# Patient Record
Sex: Male | Born: 1985 | Race: Black or African American | Hispanic: No | Marital: Married | Smoking: Never smoker
Health system: Southern US, Community
[De-identification: ages and names within clinical notes are randomized; demographics above are authoritative.]

## PROBLEM LIST (undated history)

## (undated) DIAGNOSIS — D571 Sickle-cell disease without crisis: Secondary | ICD-10-CM

---

## 2006-08-17 ENCOUNTER — Emergency Department (HOSPITAL_COMMUNITY): Admission: EM | Admit: 2006-08-17 | Discharge: 2006-08-17 | Payer: Self-pay | Admitting: Emergency Medicine

## 2016-01-18 ENCOUNTER — Observation Stay (HOSPITAL_COMMUNITY)
Admission: EM | Admit: 2016-01-18 | Discharge: 2016-01-19 | Disposition: A | Discharge status: Home / Self Care | Payer: Managed Care, Other (non HMO) | Attending: Emergency Medicine | Admitting: Emergency Medicine

## 2016-01-18 ENCOUNTER — Encounter (HOSPITAL_COMMUNITY): Payer: Self-pay

## 2016-01-18 ENCOUNTER — Emergency Department (HOSPITAL_COMMUNITY): Payer: Managed Care, Other (non HMO)

## 2016-01-18 DIAGNOSIS — D573 Sickle-cell trait: Secondary | ICD-10-CM | POA: Diagnosis not present

## 2016-01-18 DIAGNOSIS — M879 Osteonecrosis, unspecified: Secondary | ICD-10-CM

## 2016-01-18 DIAGNOSIS — M25551 Pain in right hip: Secondary | ICD-10-CM | POA: Diagnosis present

## 2016-01-18 DIAGNOSIS — M25561 Pain in right knee: Secondary | ICD-10-CM | POA: Insufficient documentation

## 2016-01-18 DIAGNOSIS — R52 Pain, unspecified: Secondary | ICD-10-CM

## 2016-01-18 DIAGNOSIS — M87851 Other osteonecrosis, right femur: Principal | ICD-10-CM | POA: Insufficient documentation

## 2016-01-18 HISTORY — DX: Sickle-cell disease without crisis: D57.1

## 2016-01-18 LAB — COMPREHENSIVE METABOLIC PANEL
ALBUMIN: 4.3 g/dL (ref 3.5–5.0)
ALK PHOS: 49 U/L (ref 38–126)
ALT: 17 U/L (ref 17–63)
AST: 26 U/L (ref 15–41)
Anion gap: 10 (ref 5–15)
BUN: 9 mg/dL (ref 6–20)
CALCIUM: 9.5 mg/dL (ref 8.9–10.3)
CO2: 24 mmol/L (ref 22–32)
CREATININE: 0.85 mg/dL (ref 0.61–1.24)
Chloride: 105 mmol/L (ref 101–111)
GFR calc non Af Amer: >60 mL/min (ref >60)
GLUCOSE: 99 mg/dL (ref 65–99)
Potassium: 4 mmol/L (ref 3.5–5.1)
SODIUM: 139 mmol/L (ref 135–145)
Total Bilirubin: 1.9 mg/dL — ABNORMAL HIGH (ref 0.3–1.2)
Total Protein: 7.1 g/dL (ref 6.5–8.1)

## 2016-01-18 LAB — CBC WITH DIFFERENTIAL/PLATELET
BASOS PCT: 0 %
Basophils Absolute: 0.1 10*3/uL (ref 0.0–0.1)
EOS ABS: 0 10*3/uL (ref 0.0–0.7)
EOS PCT: 0 %
HCT: 31 % — ABNORMAL LOW (ref 39.0–52.0)
Hemoglobin: 11.2 g/dL — ABNORMAL LOW (ref 13.0–17.0)
LYMPHS ABS: 1.5 10*3/uL (ref 0.7–4.0)
Lymphocytes Relative: 10 %
MCH: 28.2 pg (ref 26.0–34.0)
MCHC: 36.1 g/dL — AB (ref 30.0–36.0)
MCV: 78.1 fL (ref 78.0–100.0)
MONO ABS: 1.9 10*3/uL — AB (ref 0.1–1.0)
MONOS PCT: 13 %
Neutro Abs: 11.5 10*3/uL — ABNORMAL HIGH (ref 1.7–7.7)
Neutrophils Relative %: 77 %
Platelets: 368 10*3/uL (ref 150–400)
RBC: 3.97 MIL/uL — ABNORMAL LOW (ref 4.22–5.81)
RDW: 16.6 % — AB (ref 11.5–15.5)
WBC: 14.9 10*3/uL — ABNORMAL HIGH (ref 4.0–10.5)

## 2016-01-18 MED ORDER — SODIUM CHLORIDE 0.9 % IV SOLN: INTRAVENOUS | Status: DC

## 2016-01-18 MED ORDER — KETOROLAC TROMETHAMINE 15 MG/ML IJ SOLN
15.0000 mg | Freq: Once | INTRAMUSCULAR | Status: DC
  Filled 2016-01-18: qty 1

## 2016-01-18 MED ORDER — MORPHINE SULFATE (PF) 4 MG/ML IV SOLN: 4.0000 mg | Freq: Once | INTRAVENOUS | Status: DC

## 2016-01-18 MED ORDER — HYDROCODONE-ACETAMINOPHEN 5-325 MG PO TABS
2.0000 | ORAL_TABLET | Freq: Once | ORAL | Status: AC
  Administered 2016-01-18: 2 via ORAL
  Filled 2016-01-18: qty 2

## 2016-01-18 MED ORDER — KETOROLAC TROMETHAMINE 60 MG/2ML IM SOLN
60.0000 mg | Freq: Once | INTRAMUSCULAR | Status: AC
  Administered 2016-01-18: 60 mg via INTRAMUSCULAR
  Filled 2016-01-18: qty 2

## 2016-01-18 MED ORDER — HYDROMORPHONE HCL 1 MG/ML IJ SOLN
1.0000 mg | Freq: Once | INTRAMUSCULAR | Status: DC
  Filled 2016-01-18: qty 1

## 2016-01-18 NOTE — ED Notes (Signed)
Pt unsure if he wants to stay, refusing IV at this time.

## 2016-01-18 NOTE — Consult Note (Addendum)
Reason for Consult: Right hip pain Referring Physician: [name] is an 30 y.o. male.  HPI: Arthur Gutierrez is a 30 year old patient with history of sickle cell trait. Reports today history of relatively atraumatic right hip pain. He was jumping on the trampoline some but developed hip pain but several days after that to the point where it rated 8 out of 10. He is from [place] but lives here in [place]. 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 He reports having had 5 or 6 crises and his wife. They were typically treated with anti-inflammatories pain medicine and on 2 occasions IV fluids.  Past Medical History  Diagnosis Date  . Sickle cell anemia (HCC)     No past surgical history on file.  No family history on file.  Social History:  reports that he has never smoked. He does not have any smokeless tobacco history on file. He reports that he drinks about 1.8 oz of alcohol per week. He reports that he does not use illicit drugs.  Allergies: No Known Allergies  Medications: I have reviewed the patient's current medications.  Results for orders placed or performed during the hospital encounter of 01/18/16 (from the past 48 hour(s))  Comprehensive metabolic panel     Status: Abnormal   Collection Time: 01/18/16  5:53 PM  Result Value Ref Range   Sodium 139 135 - 145 mmol/L   Potassium 4.0 3.5 - 5.1 mmol/L   Chloride 105 101 - 111 mmol/L   CO2 24 22 - 32 mmol/L   Glucose, Bld 99 65 - 99 mg/dL   BUN 9 6 - 20 mg/dL   Creatinine, Ser 0.85 0.61 - 1.24 mg/dL   Calcium 9.5 8.9 - 10.3 mg/dL   Total Protein 7.1 6.5 - 8.1 g/dL   Albumin 4.3 3.5 - 5.0 g/dL   AST 26 15 - 41 U/L   ALT 17 17 - 63 U/L   Alkaline Phosphatase 49 38 - 126 U/L   Total Bilirubin 1.9 (H) 0.3 - 1.2 mg/dL   GFR calc non Af Amer >60 >60 mL/min   GFR calc Af Amer >60 >60 mL/min    Comment: (NOTE) The eGFR has been calculated using the CKD EPI equation. This calculation has not been validated in  all clinical situations. eGFR's persistently <60 mL/min signify possible Chronic Kidney Disease.    Anion gap 10 5 - 15  CBC with Differential     Status: Abnormal   Collection Time: 01/18/16  5:53 PM  Result Value Ref Range   WBC 14.9 (H) 4.0 - 10.5 K/uL   RBC 3.97 (L) 4.22 - 5.81 MIL/uL   Hemoglobin 11.2 (L) 13.0 - 17.0 g/dL   HCT 31.0 (L) 39.0 - 52.0 %   MCV 78.1 78.0 - 100.0 fL   MCH 28.2 26.0 - 34.0 pg   MCHC 36.1 (H) 30.0 - 36.0 g/dL   RDW 16.6 (H) 11.5 - 15.5 %   Platelets 368 150 - 400 K/uL   Neutrophils Relative % 77 %   Neutro Abs 11.5 (H) 1.7 - 7.7 K/uL   Lymphocytes Relative 10 %   Lymphs Abs 1.5 0.7 - 4.0 K/uL   Monocytes Relative 13 %   Monocytes Absolute 1.9 (H) 0.1 - 1.0 K/uL   Eosinophils Relative 0 %   Eosinophils Absolute 0.0 0.0 - 0.7 K/uL   Basophils Relative 0 %   Basophils Absolute 0.1 0.0 - 0.1 K/uL  Dg Knee Complete 4 Views Right  01/18/2016  CLINICAL DATA:  Right knee pain without trauma, initial encounter EXAM: RIGHT KNEE - COMPLETE 4+ VIEW COMPARISON:  None. FINDINGS: There is no evidence of fracture, dislocation, or joint effusion. There is no evidence of arthropathy or other focal bone abnormality. Soft tissues are unremarkable. IMPRESSION: No acute abnormality noted. Electronically Signed   By: Inez Catalina M.D.   On: 01/18/2016 20:58   Dg Hip Unilat With Pelvis 2-3 Views Right  01/18/2016  CLINICAL DATA:  Right hip pain for 2 days, no known trauma, initial encounter EXAM: DG HIP (WITH OR WITHOUT PELVIS) 2-3V RIGHT COMPARISON:  None. FINDINGS: Significant remodeling of the right femoral head is noted as well as significant degenerative change. No dislocation is noted. No soft tissue abnormality is seen. IMPRESSION: Degenerative changes of the right hip joint with severe remodeling of the right femoral head. These changes may be in part due to avascular necrosis. Electronically Signed   By: Inez Catalina M.D.   On: 01/18/2016 20:59    Review of  Systems  Constitutional: Negative.   HENT: Negative.   Eyes: Negative.   Respiratory: Negative.   Cardiovascular: Negative.   Gastrointestinal: Negative.   Genitourinary: Negative.   Musculoskeletal: Positive for joint pain.  Skin: Negative.   Neurological: Negative.   Endo/Heme/Allergies: Negative.   Psychiatric/Behavioral: Negative.    Blood pressure 115/84, pulse 75, temperature 97.7 F (36.5 C), temperature source Oral, resp. rate 24, height _0  (1.778 m), weight 63.702 kg (140 lb 7 oz), SpO2 98 %. Physical Exam  Constitutional: He appears well-developed.  HENT:  Head: Normocephalic.  Eyes: Pupils are equal, round, and reactive to light.  Neck: Normal range of motion.  Cardiovascular: Normal rate.   Respiratory: Effort normal.  Neurological: He is alert.  Skin: Skin is warm.  Psychiatric: He has a normal mood and affect.   patient has good ankle range of motion bilaterally has a little bit of a rash on both legs but this appears to be chronic by his history. Knee has no effusion and good range of motion on both sides. He is about a centimeter and half shorter on the right compared to the left has restricted internal rotation on the right versus left postoperatively painful. Bilateral upper extremities have good range of motion to shoulders and elbows with good radial pulses bilaterally no real restriction of range of motion of the shoulders and no pain with range of motion of the shoulders  Assessment/Plan: Impression is right hip pain could be a mild crisis or aggravation of some pre-severe pre-existing arthritis by trampoline jumping. 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 Surprisingly is not clinically that symptomatic. One month ago he describes no pain or symptoms in the right  hip. He does work as a Biomedical scientist. Plan at this time is for MRI scan as an outpatient. Plan for Naprosyn prescription as well as lichen prescription for pain control. He'll follow-up with me next week for clinical recheck and also to schedule MRI scan of the right hip on a nonemergent basis. The MRI scan is just really to confirm the avascular necrosis in joint described destruction and make sure no other atypical processes are ongoing. The left hip looks normal. On radiographs and exam  Arthur Gutierrez SCOTT 01/18/2016, 11:59 PM

## 2016-01-18 NOTE — ED Notes (Signed)
Pt reports onset 2 days right hip and right knee pain.  Pt took Naproxen and Aleve with little relief.

## 2016-01-18 NOTE — ED Notes (Signed)
Brought to B15; given gown to change into.

## 2016-01-18 NOTE — ED Provider Notes (Addendum)
CSN: [id number]     Arrival date & time 01/18/16  1639 History   First MD Initiated Contact with Patient 01/18/16 1847     Chief Complaint  Patient presents with  . Sickle Cell Pain Crisis     (Consider location/radiation/quality/duration/timing/severity/associated sxs/prior Treatment) HPI Comments: 30 year old male with sickle cell trait no significant medical problems presents with right upper knee and right groin pain since jumping on trampoline wed.  Patient has had gradual worse in pain since jumping on the trampoline. Pain with flexing the knee and hip. Patient has no arthritis history. No other injuries. No fevers or chills. Very mild upper knee swelling. Pain with range of motion  Patient is a 30 y.o. male presenting with sickle cell pain. The history is provided by the patient.  Sickle Cell Pain Crisis Associated symptoms: no chest pain, no congestion, no fever, no headaches, no shortness of breath and no vomiting     Past Medical History  Diagnosis Date  . Sickle cell anemia (HCC)    No past surgical history on file. No family history on file. Social History  Substance Use Topics  . Smoking status: Never Smoker   . Smokeless tobacco: Not on file  . Alcohol Use: 1.8 oz/week    3 Cans of beer per week    Review of Systems  Constitutional: Negative for fever and chills.  HENT: Negative for congestion.   Eyes: Negative for visual disturbance.  Respiratory: Negative for shortness of breath.   Cardiovascular: Negative for chest pain.  Gastrointestinal: Negative for vomiting and abdominal pain.  Genitourinary: Negative for dysuria and flank pain.  Musculoskeletal: Positive for joint swelling and arthralgias. Negative for back pain, neck pain and neck stiffness.  Skin: Negative for rash.  Neurological: Negative for light-headedness and headaches.      Allergies  Review of patient's allergies indicates no known allergies.  Home Medications   Prior to Admission  medications   Medication Sig Start Date End Date Taking? Authorizing Provider  ibuprofen (ADVIL,MOTRIN) 200 MG tablet Take 400 mg by mouth every 6 (six) hours as needed for moderate pain.   Yes Historical Provider, MD  naproxen (NAPROSYN) 500 MG tablet Take 500 mg by mouth 2 (two) times daily as needed for moderate pain.   Yes Historical Provider, MD   BP 115/84 mmHg  Pulse 75  Temp(Src) 97.7 F (36.5 C) (Oral)  Resp 24  Ht  (1.778 m)  Wt 140 lb 7 oz (63.702 kg)  BMI 20.15 kg/m2  SpO2 98% Physical Exam  Constitutional: He is oriented to person, place, and time. He appears well-developed and well-nourished.  HENT:  Head: Normocephalic and atraumatic.  Eyes: Conjunctivae are normal. Right eye exhibits no discharge. Left eye exhibits no discharge.  Neck: Normal range of motion. Neck supple. No tracheal deviation present.  Cardiovascular: Normal rate.   Pulmonary/Chest: Effort normal.  Abdominal: Soft. He exhibits no distension. There is no tenderness. There is no guarding.  Musculoskeletal: He exhibits tenderness. He exhibits no edema.  Patient has mild/moderate tenderness upper patella no significant knee effusion, patient has flexion extension valgus varus with no obvious ligament laxity mild limitation due to pain. Patient can flex and extend hip with mild discomfort with flexion. No calf tenderness or leg swelling. Neurovascular intact distal. No significant warmth or rash.  Neurological: He is alert and oriented to person, place, and time.  Skin: Skin is warm. No rash noted.  Psychiatric: He has a normal mood and affect.  Nursing note and vitals reviewed.   ED Course  Procedures (including critical care time) Labs Review Labs Reviewed  COMPREHENSIVE METABOLIC PANEL - Abnormal; Notable for the following:    Total Bilirubin 1.9 (*)    All other components within normal limits  CBC WITH DIFFERENTIAL/PLATELET - Abnormal; Notable for the following:    WBC 14.9 (*)    RBC  3.97 (*)    Hemoglobin 11.2 (*)    HCT 31.0 (*)    MCHC 36.1 (*)    RDW 16.6 (*)    Neutro Abs 11.5 (*)    Monocytes Absolute 1.9 (*)    All other components within normal limits    Imaging Review Dg Knee Complete 4 Views Right  01/18/2016  CLINICAL DATA:  Right knee pain without trauma, initial encounter EXAM: RIGHT KNEE - COMPLETE 4+ VIEW COMPARISON:  None. FINDINGS: There is no evidence of fracture, dislocation, or joint effusion. There is no evidence of arthropathy or other focal bone abnormality. Soft tissues are unremarkable. IMPRESSION: No acute abnormality noted. Electronically Signed   By: Alcide CleverMark  Lukens M.D.   On: 01/18/2016 20:58   Dg Hip Unilat With Pelvis 2-3 Views Right  01/18/2016  CLINICAL DATA:  Right hip pain for 2 days, no known trauma, initial encounter EXAM: DG HIP (WITH OR WITHOUT PELVIS) 2-3V RIGHT COMPARISON:  None. FINDINGS: Significant remodeling of the right femoral head is noted as well as significant degenerative change. No dislocation is noted. No soft tissue abnormality is seen. IMPRESSION: Degenerative changes of the right hip joint with severe remodeling of the right femoral head. These changes may be in part due to avascular necrosis. Electronically Signed   By: Alcide CleverMark  Lukens M.D.   On: 01/18/2016 20:59   I have personally reviewed and evaluated these images and lab results as part of my medical decision-making.   EKG Interpretation None      MDM   Final diagnoses:  Right hip pain  Osteonecrosis of right hip (HCC)   Patient presents with worsening right knee and right hip pain. Initial concern for tendinitis even no history of significant joint issues. X-ray showed significant damage the right hip joint likely osteonecrosis. Patient is sickle cell traitno other medical problems. Pain improved in the ER. Discussed the case with orthopedic Dr. August Saucerean who recommended MRI and will see the patient. Discuss  The case with [facility] hospitalist for observation however  patient refuses admission and wishes to follow-up with local orthopedic consult [place]. Patient will wait in the ER for MRI results for further details of pain.  Dr August Saucerean evaluated, arranged outpt fup.   Blane OharaJoshua Mylena Sedberry, MD 01/18/16 [id number]  Blane OharaJoshua Kye Silverstein, MD 01/19/16 [phone]

## 2016-01-19 MED ORDER — NAPROXEN 500 MG PO TABS: 500.0000 mg | ORAL_TABLET | Freq: Two times a day (BID) | ORAL | Status: DC | PRN

## 2016-01-19 MED ORDER — HYDROCODONE-ACETAMINOPHEN 5-325 MG PO TABS: 2.0000 | ORAL_TABLET | ORAL | Status: DC | PRN

## 2016-01-19 NOTE — Discharge Instructions (Signed)
°  Recommendations per orthopedics.  If you were given medicines take as directed.  If you are on coumadin or contraceptives realize their levels and effectiveness is altered by many different medicines.  If you have any reaction (rash, tongues swelling, other) to the medicines stop taking and see a physician.    If your blood pressure was elevated in the ER make sure you follow up for management with a primary doctor or return for chest pain, shortness of breath or stroke symptoms.  Please follow up as directed and return to the ER or see a physician for new or worsening symptoms.  Thank you. Filed Vitals:   01/18/16 1709 01/18/16 1930 01/18/16 2230 01/18/16 2300  BP: 136/86 111/69 113/71 115/84  Pulse: 77 62 80 75  Temp: 97.7 F (36.5 C)     TempSrc: Oral     Resp: 24     Height: 5\' 10"  (1.778 m)     Weight: 140 lb 7 oz (63.702 kg)     SpO2: 100% 100% 100% 98%

## 2016-01-19 NOTE — ED Notes (Signed)
Pt verbalized understanding of discharge instructions. E-signature not working at this time.

## 2016-01-29 ENCOUNTER — Other Ambulatory Visit: Payer: Self-pay | Admitting: Orthopedic Surgery

## 2016-01-29 DIAGNOSIS — M25551 Pain in right hip: Secondary | ICD-10-CM

## 2016-02-06 ENCOUNTER — Ambulatory Visit
Admission: RE | Admit: 2016-02-06 | Discharge: 2016-02-06 | Disposition: A | Discharge status: Home / Self Care | Payer: Managed Care, Other (non HMO) | Source: Ambulatory Visit | Attending: Orthopedic Surgery | Admitting: Orthopedic Surgery

## 2016-02-06 DIAGNOSIS — M25551 Pain in right hip: Secondary | ICD-10-CM

## 2021-05-31 ENCOUNTER — Encounter (HOSPITAL_COMMUNITY): Payer: Self-pay | Admitting: *Deleted

## 2021-05-31 ENCOUNTER — Ambulatory Visit (INDEPENDENT_AMBULATORY_CARE_PROVIDER_SITE_OTHER): Payer: 59

## 2021-05-31 ENCOUNTER — Ambulatory Visit (HOSPITAL_COMMUNITY)
Admission: EM | Admit: 2021-05-31 | Discharge: 2021-05-31 | Disposition: A | Discharge status: Home / Self Care | Payer: 59 | Attending: Physician Assistant | Admitting: Physician Assistant

## 2021-05-31 ENCOUNTER — Other Ambulatory Visit: Payer: Self-pay

## 2021-05-31 DIAGNOSIS — M25551 Pain in right hip: Secondary | ICD-10-CM

## 2021-05-31 DIAGNOSIS — R0781 Pleurodynia: Secondary | ICD-10-CM

## 2021-05-31 DIAGNOSIS — R079 Chest pain, unspecified: Secondary | ICD-10-CM

## 2021-05-31 MED ORDER — NAPROXEN 375 MG PO TABS: 375.0000 mg | ORAL_TABLET | Freq: Two times a day (BID) | ORAL | 0 refills | Status: AC

## 2021-05-31 MED ORDER — TIZANIDINE HCL 4 MG PO CAPS: 4.0000 mg | ORAL_CAPSULE | Freq: Three times a day (TID) | ORAL | 0 refills | Status: AC | PRN
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
## 2021-05-31 NOTE — ED Triage Notes (Signed)
Pt using crutches on arrival to [facility].
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
## 2021-05-31 NOTE — Discharge Instructions (Addendum)
Your x-rays are normal with no acute abnormalities.  I have called in an anti-inflammatory medication known as Naprosyn that you can take twice daily.  You should not take additional NSAIDs including aspirin, ibuprofen/Advil, naproxen/Aleve with this medication as it can cause stomach bleeding.  I have also called in a muscle relaxer known as tizanidine that you can take up to 3 times a day.  This make you sleepy do not drive or drink alcohol while taking it.  You can use Tylenol for breakthrough pain.  I recommend you use heat, rest, stretch for additional symptom relief.  It may be worthwhile to contact your primary care provider for further evaluation and to consider physical therapy referral.  If you have any sudden worsening of symptoms you need to be reevaluated.
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
